# Patient Record
Sex: Female | Born: 1955 | Race: Black or African American | Hispanic: No | Marital: Married | Smoking: Never smoker
Health system: Southern US, Community
[De-identification: ages and names within clinical notes are randomized; demographics above are authoritative.]

## PROBLEM LIST (undated history)

## (undated) HISTORY — PX: UTERINE FIBROID EMBOLIZATION: SHX825

## (undated) HISTORY — PX: TUBAL LIGATION: SHX77

---

## 1998-09-20 ENCOUNTER — Other Ambulatory Visit: Admission: RE | Admit: 1998-09-20 | Discharge: 1998-09-20 | Payer: Self-pay | Admitting: Gynecology

## 1998-10-22 ENCOUNTER — Other Ambulatory Visit: Admission: RE | Admit: 1998-10-22 | Discharge: 1998-10-22 | Payer: Self-pay | Admitting: Gynecology

## 1998-10-22 ENCOUNTER — Encounter (INDEPENDENT_AMBULATORY_CARE_PROVIDER_SITE_OTHER): Payer: Self-pay | Admitting: Specialist

## 1998-11-21 ENCOUNTER — Encounter (INDEPENDENT_AMBULATORY_CARE_PROVIDER_SITE_OTHER): Payer: Self-pay

## 1998-11-21 ENCOUNTER — Other Ambulatory Visit: Admission: RE | Admit: 1998-11-21 | Discharge: 1998-11-21 | Payer: Self-pay | Admitting: Obstetrics

## 1998-11-23 ENCOUNTER — Encounter: Payer: Self-pay | Admitting: Obstetrics

## 1998-11-23 ENCOUNTER — Ambulatory Visit (HOSPITAL_COMMUNITY): Admission: RE | Admit: 1998-11-23 | Discharge: 1998-11-23 | Payer: Self-pay | Admitting: Obstetrics

## 1998-11-28 ENCOUNTER — Encounter (HOSPITAL_COMMUNITY): Admission: RE | Admit: 1998-11-28 | Discharge: 1999-02-26 | Payer: Self-pay | Admitting: Obstetrics

## 1998-12-21 ENCOUNTER — Encounter: Payer: Self-pay | Admitting: Diagnostic Radiology

## 1998-12-21 ENCOUNTER — Ambulatory Visit (HOSPITAL_COMMUNITY): Admission: RE | Admit: 1998-12-21 | Discharge: 1998-12-21 | Payer: Self-pay | Admitting: Diagnostic Radiology

## 1999-01-02 ENCOUNTER — Encounter: Payer: Self-pay | Admitting: Obstetrics

## 1999-01-02 ENCOUNTER — Ambulatory Visit (HOSPITAL_COMMUNITY): Admission: RE | Admit: 1999-01-02 | Discharge: 1999-01-02 | Payer: Self-pay | Admitting: Obstetrics

## 1999-02-01 ENCOUNTER — Observation Stay (HOSPITAL_COMMUNITY): Admission: RE | Admit: 1999-02-01 | Discharge: 1999-02-02 | Payer: Self-pay | Admitting: Obstetrics

## 1999-02-01 ENCOUNTER — Encounter: Payer: Self-pay | Admitting: Obstetrics

## 1999-08-05 ENCOUNTER — Ambulatory Visit (HOSPITAL_COMMUNITY): Admission: RE | Admit: 1999-08-05 | Discharge: 1999-08-05 | Payer: Self-pay | Admitting: Diagnostic Radiology

## 1999-08-05 ENCOUNTER — Encounter: Payer: Self-pay | Admitting: Diagnostic Radiology

## 2000-01-07 ENCOUNTER — Ambulatory Visit (HOSPITAL_COMMUNITY): Admission: RE | Admit: 2000-01-07 | Discharge: 2000-01-07 | Payer: Self-pay | Admitting: Obstetrics

## 2000-01-07 ENCOUNTER — Encounter: Payer: Self-pay | Admitting: Obstetrics

## 2000-04-15 ENCOUNTER — Other Ambulatory Visit: Admission: RE | Admit: 2000-04-15 | Discharge: 2000-04-15 | Payer: Self-pay | Admitting: Obstetrics

## 2001-04-30 ENCOUNTER — Encounter: Payer: Self-pay | Admitting: Obstetrics

## 2001-04-30 ENCOUNTER — Ambulatory Visit (HOSPITAL_COMMUNITY): Admission: RE | Admit: 2001-04-30 | Discharge: 2001-04-30 | Payer: Self-pay | Admitting: Obstetrics

## 2002-07-11 ENCOUNTER — Encounter: Payer: Self-pay | Admitting: Obstetrics

## 2002-07-11 ENCOUNTER — Ambulatory Visit (HOSPITAL_COMMUNITY): Admission: RE | Admit: 2002-07-11 | Discharge: 2002-07-11 | Payer: Self-pay | Admitting: Obstetrics

## 2003-10-09 ENCOUNTER — Ambulatory Visit (HOSPITAL_COMMUNITY): Admission: RE | Admit: 2003-10-09 | Discharge: 2003-10-09 | Payer: Self-pay | Admitting: Obstetrics

## 2004-12-17 ENCOUNTER — Ambulatory Visit (HOSPITAL_COMMUNITY): Admission: RE | Admit: 2004-12-17 | Discharge: 2004-12-17 | Payer: Self-pay

## 2005-01-16 ENCOUNTER — Ambulatory Visit (HOSPITAL_COMMUNITY): Admission: RE | Admit: 2005-01-16 | Discharge: 2005-01-16 | Payer: Self-pay | Admitting: Obstetrics

## 2008-05-02 ENCOUNTER — Ambulatory Visit (HOSPITAL_COMMUNITY): Admission: RE | Admit: 2008-05-02 | Discharge: 2008-05-02 | Payer: Self-pay | Admitting: Obstetrics

## 2010-03-01 ENCOUNTER — Ambulatory Visit (HOSPITAL_COMMUNITY)
Admission: RE | Admit: 2010-03-01 | Discharge: 2010-03-01 | Payer: Self-pay | Source: Home / Self Care | Admitting: Obstetrics

## 2010-03-11 ENCOUNTER — Encounter
Admission: RE | Admit: 2010-03-11 | Discharge: 2010-03-11 | Payer: Self-pay | Source: Home / Self Care | Attending: Obstetrics | Admitting: Obstetrics

## 2010-06-29 ENCOUNTER — Inpatient Hospital Stay (INDEPENDENT_AMBULATORY_CARE_PROVIDER_SITE_OTHER)
Admission: RE | Admit: 2010-06-29 | Discharge: 2010-06-29 | Disposition: A | Discharge status: Home / Self Care | Payer: 59 | Source: Ambulatory Visit | Attending: Family Medicine | Admitting: Family Medicine

## 2010-06-29 ENCOUNTER — Ambulatory Visit (INDEPENDENT_AMBULATORY_CARE_PROVIDER_SITE_OTHER): Payer: 59

## 2010-06-29 DIAGNOSIS — K5900 Constipation, unspecified: Secondary | ICD-10-CM

## 2010-06-29 DIAGNOSIS — R071 Chest pain on breathing: Secondary | ICD-10-CM

## 2010-06-29 LAB — POCT URINALYSIS DIP (DEVICE)
Bilirubin Urine: NEGATIVE
Glucose, UA: NEGATIVE mg/dL
Ketones, ur: NEGATIVE mg/dL
Protein, ur: NEGATIVE mg/dL
Specific Gravity, Urine: 1.015 (ref 1.005–1.030)
Urobilinogen, UA: 0.2 mg/dL (ref 0.0–1.0)

## 2010-07-01 LAB — URINE CULTURE

## 2011-02-26 ENCOUNTER — Other Ambulatory Visit (HOSPITAL_COMMUNITY): Payer: Self-pay | Admitting: Obstetrics

## 2011-02-26 DIAGNOSIS — Z1231 Encounter for screening mammogram for malignant neoplasm of breast: Secondary | ICD-10-CM

## 2011-04-03 ENCOUNTER — Ambulatory Visit (HOSPITAL_COMMUNITY)
Admission: RE | Admit: 2011-04-03 | Discharge: 2011-04-03 | Disposition: A | Discharge status: Home / Self Care | Payer: 59 | Source: Ambulatory Visit | Attending: Obstetrics | Admitting: Obstetrics

## 2011-04-03 DIAGNOSIS — Z1231 Encounter for screening mammogram for malignant neoplasm of breast: Secondary | ICD-10-CM | POA: Insufficient documentation

## 2013-01-11 ENCOUNTER — Other Ambulatory Visit (HOSPITAL_COMMUNITY): Payer: Self-pay | Admitting: Obstetrics

## 2013-01-11 DIAGNOSIS — Z1231 Encounter for screening mammogram for malignant neoplasm of breast: Secondary | ICD-10-CM

## 2013-01-28 ENCOUNTER — Other Ambulatory Visit (HOSPITAL_COMMUNITY): Payer: Self-pay | Admitting: Obstetrics

## 2013-01-28 ENCOUNTER — Ambulatory Visit (HOSPITAL_COMMUNITY)
Admission: RE | Admit: 2013-01-28 | Discharge: 2013-01-28 | Disposition: A | Discharge status: Home / Self Care | Payer: 59 | Source: Ambulatory Visit | Attending: Obstetrics | Admitting: Obstetrics

## 2013-01-28 DIAGNOSIS — Z1231 Encounter for screening mammogram for malignant neoplasm of breast: Secondary | ICD-10-CM | POA: Insufficient documentation

## 2013-01-31 ENCOUNTER — Ambulatory Visit (HOSPITAL_COMMUNITY): Admission: RE | Admit: 2013-01-31 | Payer: 59 | Source: Ambulatory Visit

## 2014-02-20 ENCOUNTER — Ambulatory Visit
Admission: RE | Admit: 2014-02-20 | Discharge: 2014-02-20 | Disposition: A | Discharge status: Home / Self Care | Payer: 59 | Source: Ambulatory Visit | Attending: Family Medicine | Admitting: Family Medicine

## 2014-02-20 ENCOUNTER — Other Ambulatory Visit: Payer: Self-pay | Admitting: Family Medicine

## 2014-02-20 DIAGNOSIS — M542 Cervicalgia: Secondary | ICD-10-CM

## 2014-02-20 DIAGNOSIS — M545 Low back pain: Secondary | ICD-10-CM

## 2014-03-03 ENCOUNTER — Ambulatory Visit: Payer: 59 | Attending: Family Medicine | Admitting: Physical Therapy

## 2014-03-03 DIAGNOSIS — M545 Low back pain: Secondary | ICD-10-CM | POA: Insufficient documentation

## 2014-03-08 ENCOUNTER — Ambulatory Visit: Payer: 59 | Admitting: Physical Therapy

## 2014-03-08 DIAGNOSIS — M545 Low back pain: Secondary | ICD-10-CM | POA: Diagnosis not present

## 2014-03-10 ENCOUNTER — Ambulatory Visit: Payer: 59 | Admitting: Physical Therapy

## 2014-03-10 DIAGNOSIS — M545 Low back pain: Secondary | ICD-10-CM | POA: Diagnosis not present

## 2014-03-13 ENCOUNTER — Ambulatory Visit: Payer: 59 | Admitting: Physical Therapy

## 2014-03-13 DIAGNOSIS — M545 Low back pain: Secondary | ICD-10-CM | POA: Diagnosis not present

## 2014-03-14 ENCOUNTER — Encounter: Payer: 59 | Admitting: Physical Therapy

## 2014-03-17 ENCOUNTER — Encounter: Payer: 59 | Admitting: Physical Therapy

## 2014-03-17 ENCOUNTER — Ambulatory Visit: Payer: 59 | Admitting: Physical Therapy

## 2014-03-17 DIAGNOSIS — M545 Low back pain: Secondary | ICD-10-CM | POA: Diagnosis not present

## 2014-03-20 ENCOUNTER — Ambulatory Visit: Payer: 59 | Admitting: Physical Therapy

## 2014-03-20 DIAGNOSIS — M545 Low back pain: Secondary | ICD-10-CM | POA: Diagnosis not present

## 2014-03-28 ENCOUNTER — Ambulatory Visit: Payer: 59 | Admitting: Physical Therapy

## 2014-03-28 DIAGNOSIS — M545 Low back pain: Secondary | ICD-10-CM | POA: Diagnosis not present

## 2014-04-03 ENCOUNTER — Ambulatory Visit: Payer: 59 | Attending: Family Medicine | Admitting: Physical Therapy

## 2014-04-03 DIAGNOSIS — M545 Low back pain: Secondary | ICD-10-CM | POA: Diagnosis not present

## 2014-04-07 ENCOUNTER — Ambulatory Visit: Payer: 59 | Admitting: Physical Therapy

## 2014-04-07 DIAGNOSIS — M545 Low back pain: Secondary | ICD-10-CM | POA: Diagnosis not present

## 2014-04-11 ENCOUNTER — Ambulatory Visit: Payer: 59 | Admitting: Physical Therapy

## 2014-04-11 DIAGNOSIS — M545 Low back pain: Secondary | ICD-10-CM | POA: Diagnosis not present

## 2014-04-15 DIAGNOSIS — M545 Low back pain: Secondary | ICD-10-CM | POA: Diagnosis present

## 2014-04-17 ENCOUNTER — Ambulatory Visit: Payer: 59 | Admitting: Physical Therapy

## 2014-04-17 DIAGNOSIS — M545 Low back pain: Secondary | ICD-10-CM | POA: Diagnosis not present

## 2014-04-21 ENCOUNTER — Ambulatory Visit: Payer: 59 | Admitting: Physical Therapy

## 2014-04-24 ENCOUNTER — Ambulatory Visit: Payer: 59 | Admitting: Physical Therapy

## 2018-11-15 ENCOUNTER — Ambulatory Visit (HOSPITAL_COMMUNITY)
Admission: EM | Admit: 2018-11-15 | Discharge: 2018-11-15 | Disposition: A | Discharge status: Home / Self Care | Payer: BC Managed Care – PPO | Attending: Family Medicine | Admitting: Family Medicine

## 2018-11-15 ENCOUNTER — Encounter (HOSPITAL_COMMUNITY): Payer: Self-pay

## 2018-11-15 ENCOUNTER — Other Ambulatory Visit: Payer: Self-pay

## 2018-11-15 DIAGNOSIS — R002 Palpitations: Secondary | ICD-10-CM

## 2018-11-15 DIAGNOSIS — R42 Dizziness and giddiness: Secondary | ICD-10-CM | POA: Insufficient documentation

## 2018-11-15 LAB — CBC WITH DIFFERENTIAL/PLATELET
Abs Immature Granulocytes: 0.03 10*3/uL (ref 0.00–0.07)
Basophils Absolute: 0 10*3/uL (ref 0.0–0.1)
Basophils Relative: 1 %
Eosinophils Absolute: 0 10*3/uL (ref 0.0–0.5)
Eosinophils Relative: 0 %
HCT: 39.2 % (ref 36.0–46.0)
Hemoglobin: 13 g/dL (ref 12.0–15.0)
Immature Granulocytes: 1 %
Lymphocytes Relative: 42 %
Lymphs Abs: 2.5 10*3/uL (ref 0.7–4.0)
MCH: 32 pg (ref 26.0–34.0)
MCHC: 33.2 g/dL (ref 30.0–36.0)
MCV: 96.6 fL (ref 80.0–100.0)
Monocytes Absolute: 0.4 10*3/uL (ref 0.1–1.0)
Monocytes Relative: 8 %
Neutro Abs: 2.8 10*3/uL (ref 1.7–7.7)
Neutrophils Relative %: 48 %
Platelets: 227 10*3/uL (ref 150–400)
RBC: 4.06 MIL/uL (ref 3.87–5.11)
RDW: 12.3 % (ref 11.5–15.5)
WBC: 5.8 10*3/uL (ref 4.0–10.5)
nRBC: 0 % (ref 0.0–0.2)

## 2018-11-15 LAB — COMPREHENSIVE METABOLIC PANEL
ALT: 20 U/L (ref 0–44)
AST: 27 U/L (ref 15–41)
Albumin: 4.1 g/dL (ref 3.5–5.0)
Alkaline Phosphatase: 88 U/L (ref 38–126)
Anion gap: 11 (ref 5–15)
BUN: 11 mg/dL (ref 8–23)
CO2: 26 mmol/L (ref 22–32)
Calcium: 9.7 mg/dL (ref 8.9–10.3)
Chloride: 103 mmol/L (ref 98–111)
Creatinine, Ser: 0.81 mg/dL (ref 0.44–1.00)
GFR calc Af Amer: >60 mL/min (ref >60)
GFR calc non Af Amer: >60 mL/min (ref >60)
Glucose, Bld: 95 mg/dL (ref 70–99)
Potassium: 3.9 mmol/L (ref 3.5–5.1)
Sodium: 140 mmol/L (ref 135–145)
Total Bilirubin: 0.6 mg/dL (ref 0.3–1.2)
Total Protein: 7.5 g/dL (ref 6.5–8.1)

## 2018-11-15 LAB — POCT URINALYSIS DIP (DEVICE)
Bilirubin Urine: NEGATIVE
Glucose, UA: NEGATIVE mg/dL
Ketones, ur: NEGATIVE mg/dL
Leukocytes,Ua: NEGATIVE
Nitrite: NEGATIVE
Protein, ur: NEGATIVE mg/dL
Specific Gravity, Urine: 1.025 (ref 1.005–1.030)
Urobilinogen, UA: 0.2 mg/dL (ref 0.0–1.0)
pH: 7 (ref 5.0–8.0)

## 2018-11-15 LAB — TSH: TSH: 1.34 u[IU]/mL (ref 0.350–4.500)

## 2018-11-15 NOTE — ED Provider Notes (Signed)
[facility]    CSN: [id number] Arrival date & time: 11/15/18  1305     History   Chief Complaint Chief Complaint  Patient presents with  . Appointment    110  . Diarrhea    HPI Heather Stark is a 63 y.o. female.   Heather Stark presents with complaints of episodes of palpitations with nausea and dizziness. This started two weeks ago. She had two days of diarrhea. Once this had resolved, she was working with her husband, and was up in a hot attic when her symptoms started. Her husband felt the same way. Once they came down from the attic into the cool he improved. She feels like the episodes have persisted. She has since had normal bowel movements. She currently feels like her heart is strongly beating and has some dizziness. No chest pain . No cough, no sore throat, no nausea or vomiting. Denies any previous similar episodes. She has been eating and drinking. She feels like she has been urinating more frequently, and when she urinates this causes her symptoms of dizziness and palpitations to increase. She had two episodes yesterday. States that last night she noted that her heart was beating quickly and then she felt discomfort to her left neck as well as to her left arm which also felt tingly. She denies any of these symptoms currently. She has no medical history, she takes supplements only, no prescribed medications. She has been taking electrolyte drinks which have helped just briefly with her symptoms before they return.     ROS per HPI, negative if not otherwise mentioned.      History reviewed. No pertinent past medical history.  There are no active problems to display for this patient.   Past Surgical History:  Procedure Laterality Date  . TUBAL LIGATION    . UTERINE FIBROID EMBOLIZATION      OB History   No obstetric history on file.      Home Medications    Prior to Admission medications   Medication Sig Start Date End Date Taking?  Authorizing Provider  Magnesium 400 MG CAPS Take by mouth.   Yes [provider]  Multiple Vitamins-Minerals (MULTIVITAMIN WITH MINERALS) tablet Take 1 tablet by mouth daily.   Yes [provider]  potassium chloride (MICRO-K) 10 MEQ CR capsule Take 10 mEq by mouth 2 (two) times daily.   Yes [provider]  Probiotic Product (UP4 PROBIOTICS ADULT PO) Take by mouth.   Yes [provider]  Turmeric (QC TUMERIC COMPLEX) 500 MG CAPS Take by mouth.   Yes [provider]    Family History Family History  Problem Relation Age of Onset  . Healthy Mother   . Healthy Father     Social History Social History   Tobacco Use  . Smoking status: Never Smoker  . Smokeless tobacco: Never Used  Substance Use Topics  . Alcohol use: Never    Frequency: Never  . Drug use: Never     Allergies   Tetracyclines & related   Review of Systems Review of Systems   Physical Exam Triage Vital Signs ED Triage Vitals  Enc Vitals Group     BP 11/15/18 1338 (!) 150/87     Pulse Rate 11/15/18 1338 98     Resp 11/15/18 1338 18     Temp 11/15/18 1338 98.2 F (36.8 C)     Temp src --      SpO2 11/15/18 1338 99 %  Weight --      Height --      Head Circumference --      Peak Flow --      Pain Score 11/15/18 1339 0     Pain Loc --      Pain Edu? --      Excl. in GC? --    Orthostatic VS for the past 24 hrs:  BP- Lying Pulse- Lying BP- Sitting Pulse- Sitting BP- Standing at 0 minutes Pulse- Standing at 0 minutes  11/15/18 1443 140/85 87 134/84 91 (!) 145/98 93    Updated Vital Signs BP (!) 150/87   Pulse 98   Temp 98.2 F (36.8 C)   Resp 18   SpO2 99%    Physical Exam Constitutional:      General: She is not in acute distress.    Appearance: She is well-developed.  HENT:     Head: Normocephalic and atraumatic.  Eyes:     Extraocular Movements: Extraocular movements intact.     Pupils: Pupils are equal, round, and reactive to light.   Neck:     Musculoskeletal: Full passive range of motion without pain and normal range of motion. No pain with movement.  Cardiovascular:     Rate and Rhythm: Normal rate and regular rhythm.     Heart sounds: Normal heart sounds.  Pulmonary:     Effort: Pulmonary effort is normal.     Breath sounds: Normal breath sounds.  Skin:    General: Skin is warm and dry.  Neurological:     General: No focal deficit present.     Mental Status: She is alert and oriented to person, place, and time.     Cranial Nerves: No cranial nerve deficit.     Sensory: No sensory deficit.     Motor: No weakness.     Gait: Gait normal.  Psychiatric:        Mood and Affect: Mood normal.     EKG:  NSR rate of 92   . Previous EKG was not available for review. t wave flattening noted to v2. No other acute changes noted.  Reviewed with supervising physician dr. Tracie Harrierhagler    UC Treatments / Results  Labs (all labs ordered are listed, but only abnormal results are displayed) Labs Reviewed  POCT URINALYSIS DIP (DEVICE) - Abnormal; Notable for the following components:      Result Value   Hgb urine dipstick TRACE (*)    All other components within normal limits  COMPREHENSIVE METABOLIC PANEL  CBC WITH DIFFERENTIAL/PLATELET  TSH    EKG   Radiology No results found.  Procedures Procedures (including critical care time)  Medications Ordered in UC Medications - No data to display  Initial Impression / Assessment and Plan / UC Course  I have reviewed the triage vital signs and the nursing notes.  Pertinent labs & imaging results that were available during my care of the patient were reviewed by me and considered in my medical decision making (see chart for details).     Exam is overall reassuring. ekg reassuring. Urine normal. Labs pending. Question sensation of palpitations with neck and arm pain- encouraged to go to ER if this recurs as may need acs work up. No current symptoms like this. No red flag  findings on physical exam. Encouraged to establish care with PCP for recheck and management. Patient has been taking OTC supplementations including potassium as well, will see what CMP looks like. Return precautions provided.  Ambulatory out of clinic without difficulty.   Final Clinical Impressions(s) / UC Diagnoses   Final diagnoses:  Palpitations  Dizziness     Discharge Instructions     Your ekg and urine look good today.  Your other labs are still in process.  Will notify of any positive findings and if any changes to treatment are needed.   Continue to drink plenty of fluids to maintain hydration.  If any worsening of pain with jaw/arm pain or any associated nausea or sweating please go to the ER as may need further work-up. I do think its appropriate for you to follow up with cardiology and with primary care, especially if this persists.     ED Prescriptions    None     Controlled Substance Prescriptions [place] Controlled Substance Registry consulted? Not Applicable   Zigmund Gottron, NP 11/15/18 1557

## 2018-11-15 NOTE — ED Triage Notes (Signed)
Pt presents with complaints of diarrhea that lasted 2 days and started 2 weeks ago. States that she then went and worked in the heat about a week and a half ago. When she left the job that day she felt very dizzy and like her heart rate was fast. She reports that she is still feeling dehydrated and has been having aching in her neck.

## 2018-11-15 NOTE — Discharge Instructions (Signed)
Your ekg and urine look good today.  Your other labs are still in process.  Will notify of any positive findings and if any changes to treatment are needed.   Continue to drink plenty of fluids to maintain hydration.  If any worsening of pain with jaw/arm pain or any associated nausea or sweating please go to the ER as may need further work-up. I do think its appropriate for you to follow up with cardiology and with primary care, especially if this persists.

## 2019-02-07 DIAGNOSIS — E785 Hyperlipidemia, unspecified: Secondary | ICD-10-CM | POA: Diagnosis not present

## 2019-02-07 DIAGNOSIS — Z1211 Encounter for screening for malignant neoplasm of colon: Secondary | ICD-10-CM | POA: Diagnosis not present

## 2019-02-07 DIAGNOSIS — Z1231 Encounter for screening mammogram for malignant neoplasm of breast: Secondary | ICD-10-CM | POA: Diagnosis not present

## 2019-02-07 DIAGNOSIS — K5901 Slow transit constipation: Secondary | ICD-10-CM | POA: Diagnosis not present

## 2019-02-08 ENCOUNTER — Other Ambulatory Visit: Payer: Self-pay | Admitting: Internal Medicine

## 2019-02-08 DIAGNOSIS — Z1231 Encounter for screening mammogram for malignant neoplasm of breast: Secondary | ICD-10-CM

## 2019-03-31 ENCOUNTER — Ambulatory Visit
Admission: RE | Admit: 2019-03-31 | Discharge: 2019-03-31 | Disposition: A | Discharge status: Home / Self Care | Payer: BC Managed Care – PPO | Source: Ambulatory Visit | Attending: Internal Medicine | Admitting: Internal Medicine

## 2019-03-31 ENCOUNTER — Other Ambulatory Visit: Payer: Self-pay

## 2019-03-31 DIAGNOSIS — Z1231 Encounter for screening mammogram for malignant neoplasm of breast: Secondary | ICD-10-CM

## 2019-04-04 ENCOUNTER — Other Ambulatory Visit: Payer: Self-pay | Admitting: Internal Medicine

## 2019-04-04 DIAGNOSIS — R928 Other abnormal and inconclusive findings on diagnostic imaging of breast: Secondary | ICD-10-CM

## 2019-04-06 ENCOUNTER — Ambulatory Visit
Admission: RE | Admit: 2019-04-06 | Discharge: 2019-04-06 | Disposition: A | Discharge status: Home / Self Care | Payer: BC Managed Care – PPO | Source: Ambulatory Visit | Attending: Internal Medicine | Admitting: Internal Medicine

## 2019-04-06 DIAGNOSIS — R928 Other abnormal and inconclusive findings on diagnostic imaging of breast: Secondary | ICD-10-CM

## 2019-04-06 DIAGNOSIS — R922 Inconclusive mammogram: Secondary | ICD-10-CM | POA: Diagnosis not present

## 2019-04-06 DIAGNOSIS — N6002 Solitary cyst of left breast: Secondary | ICD-10-CM | POA: Diagnosis not present

## 2019-05-11 DIAGNOSIS — Z1159 Encounter for screening for other viral diseases: Secondary | ICD-10-CM | POA: Diagnosis not present

## 2019-05-16 DIAGNOSIS — K635 Polyp of colon: Secondary | ICD-10-CM | POA: Diagnosis not present

## 2019-05-16 DIAGNOSIS — Z1211 Encounter for screening for malignant neoplasm of colon: Secondary | ICD-10-CM | POA: Diagnosis not present

## 2019-06-04 ENCOUNTER — Ambulatory Visit: Payer: BC Managed Care – PPO | Attending: Internal Medicine

## 2019-06-04 DIAGNOSIS — Z23 Encounter for immunization: Secondary | ICD-10-CM | POA: Insufficient documentation

## 2019-06-04 NOTE — Progress Notes (Signed)
   Covid-19 Vaccination Clinic  Name:  Heather Stark    MRN: [id number] DOB: 07-Jun-1955  06/04/2019  [name] was observed post Covid-19 immunization for 15 minutes without incident. She was provided with Vaccine Information Sheet and instruction to access the V-Safe system.   [name] was instructed to call 911 with any severe reactions post vaccine: Marland Kitchen Difficulty breathing  . Swelling of face and throat  . A fast heartbeat  . A bad rash all over body  . Dizziness and weakness   Immunizations Administered    Name Date Dose VIS Date Route   Pfizer COVID-19 Vaccine 06/04/2019  1:12 PM 0.3 mL 03/11/2019 Intramuscular   Manufacturer: [employer], [employer]   Lot: [id number]   NDC: [address]-1

## 2019-06-25 ENCOUNTER — Ambulatory Visit: Payer: BC Managed Care – PPO

## 2019-06-27 ENCOUNTER — Ambulatory Visit: Payer: BC Managed Care – PPO | Attending: Internal Medicine

## 2019-06-27 DIAGNOSIS — Z23 Encounter for immunization: Secondary | ICD-10-CM

## 2019-06-27 NOTE — Progress Notes (Signed)
   Covid-19 Vaccination Clinic  Name:  SHRISTI SCHEIB    MRN: [id number] DOB: 14-Mar-1956  06/27/2019  Ms. Basden was observed post Covid-19 immunization for 15 minutes without incident. She was provided with Vaccine Information Sheet and instruction to access the V-Safe system.   Ms. Dade was instructed to call 911 with any severe reactions post vaccine: Marland Kitchen Difficulty breathing  . Swelling of face and throat  . A fast heartbeat  . A bad rash all over body  . Dizziness and weakness   Immunizations Administered    Name Date Dose VIS Date Route   Pfizer COVID-19 Vaccine 06/27/2019 11:22 AM 0.3 mL 03/11/2019 Intramuscular   Manufacturer: [employer], [employer]   Lot: [id number]   NDC: [address]-0

## 2019-07-05 ENCOUNTER — Ambulatory Visit: Payer: BC Managed Care – PPO

## 2019-07-06 ENCOUNTER — Ambulatory Visit: Payer: BC Managed Care – PPO

## 2019-07-11 DIAGNOSIS — L658 Other specified nonscarring hair loss: Secondary | ICD-10-CM | POA: Diagnosis not present

## 2019-07-22 ENCOUNTER — Other Ambulatory Visit: Payer: Self-pay | Admitting: Internal Medicine

## 2019-07-22 ENCOUNTER — Other Ambulatory Visit (HOSPITAL_COMMUNITY)
Admission: RE | Admit: 2019-07-22 | Discharge: 2019-07-22 | Disposition: A | Discharge status: Home / Self Care | Payer: BC Managed Care – PPO | Source: Ambulatory Visit | Attending: Internal Medicine | Admitting: Internal Medicine

## 2019-07-22 DIAGNOSIS — Z124 Encounter for screening for malignant neoplasm of cervix: Secondary | ICD-10-CM | POA: Diagnosis not present

## 2019-07-22 DIAGNOSIS — Z0000 Encounter for general adult medical examination without abnormal findings: Secondary | ICD-10-CM | POA: Diagnosis not present

## 2019-07-27 LAB — CYTOLOGY - PAP
Adequacy: ABSENT
Comment: NEGATIVE
Diagnosis: NEGATIVE
High risk HPV: NEGATIVE

## 2020-07-27 ENCOUNTER — Other Ambulatory Visit: Payer: Self-pay | Admitting: Internal Medicine

## 2020-07-27 DIAGNOSIS — E2839 Other primary ovarian failure: Secondary | ICD-10-CM

## 2020-07-27 DIAGNOSIS — Z1231 Encounter for screening mammogram for malignant neoplasm of breast: Secondary | ICD-10-CM

## 2020-08-30 ENCOUNTER — Ambulatory Visit
Admission: RE | Admit: 2020-08-30 | Discharge: 2020-08-30 | Disposition: A | Discharge status: Home / Self Care | Payer: 59 | Source: Ambulatory Visit | Attending: Internal Medicine | Admitting: Internal Medicine

## 2020-08-30 ENCOUNTER — Other Ambulatory Visit: Payer: Self-pay

## 2020-08-30 DIAGNOSIS — Z1231 Encounter for screening mammogram for malignant neoplasm of breast: Secondary | ICD-10-CM

## 2020-08-30 DIAGNOSIS — E2839 Other primary ovarian failure: Secondary | ICD-10-CM

## 2021-03-16 IMAGING — MG DIGITAL SCREENING BILAT W/ TOMO W/ CAD
8 series · 9 of 24 positions shown · non-contrast
Comparison: Previous exam(s).

CLINICAL DATA: Screening.

EXAM:
DIGITAL SCREENING BILATERAL MAMMOGRAM WITH TOMO AND CAD

[R CC synth-2D]
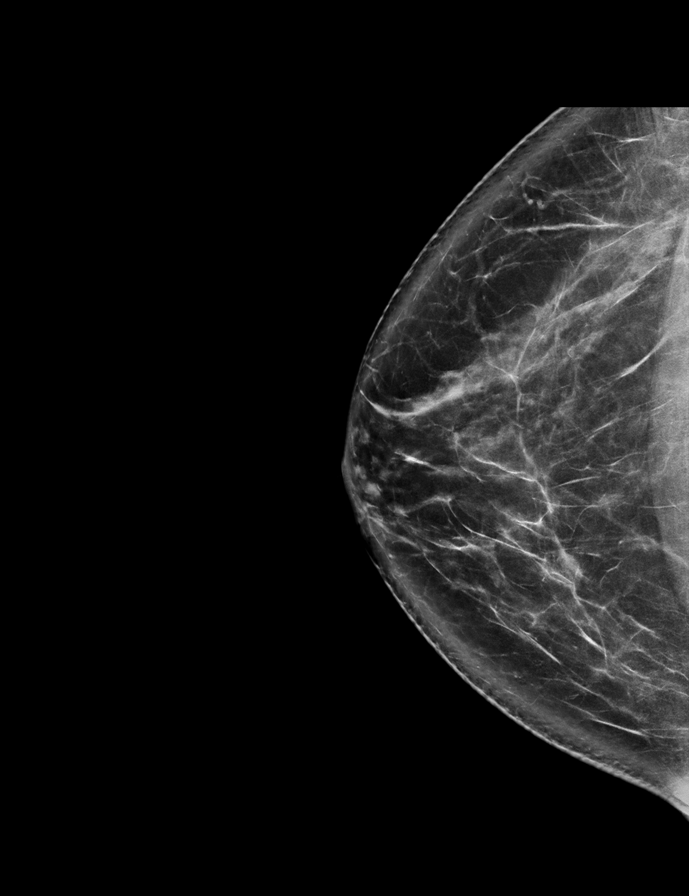

[R MLO synth-2D]
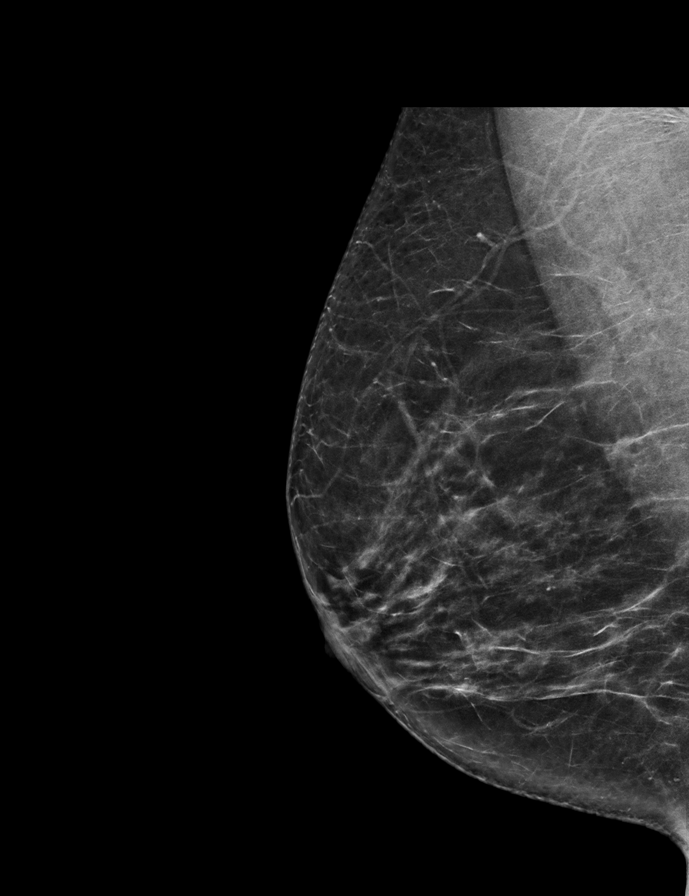

[L CC synth-2D]
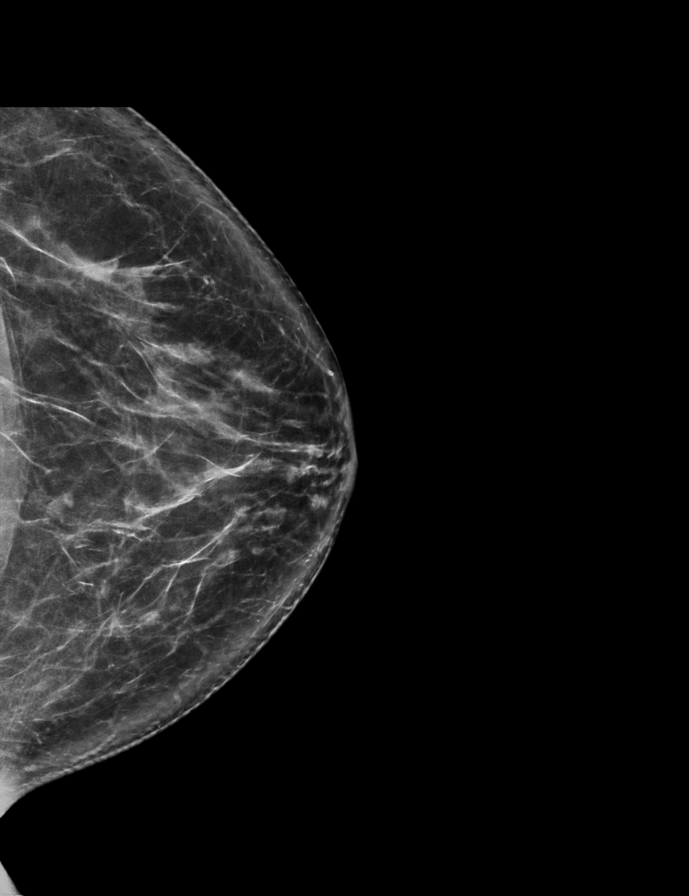

[L MLO synth-2D]
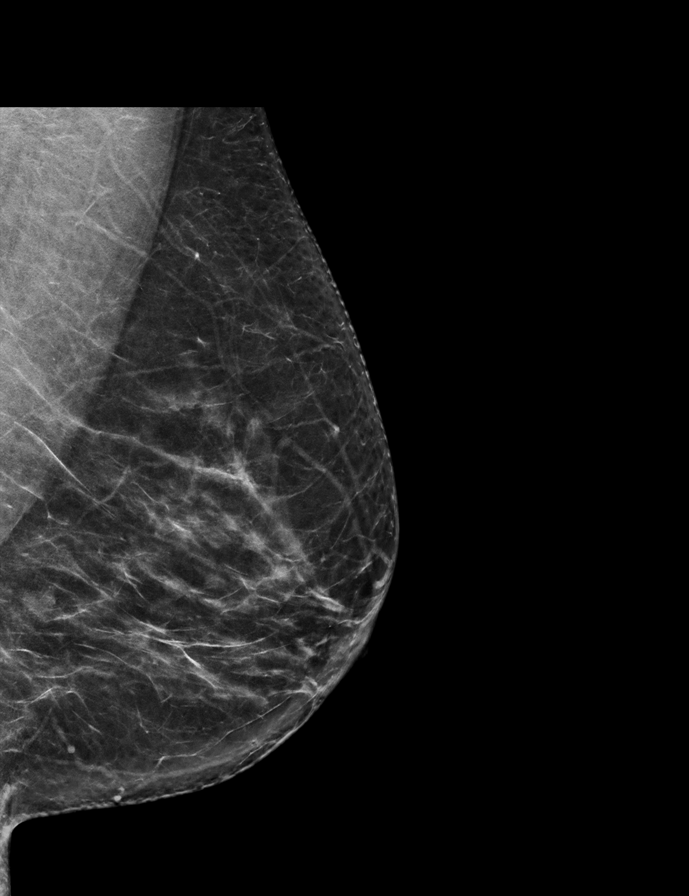

[L CC tomo · 2 of 75 frames shown]
[frame 25/75]
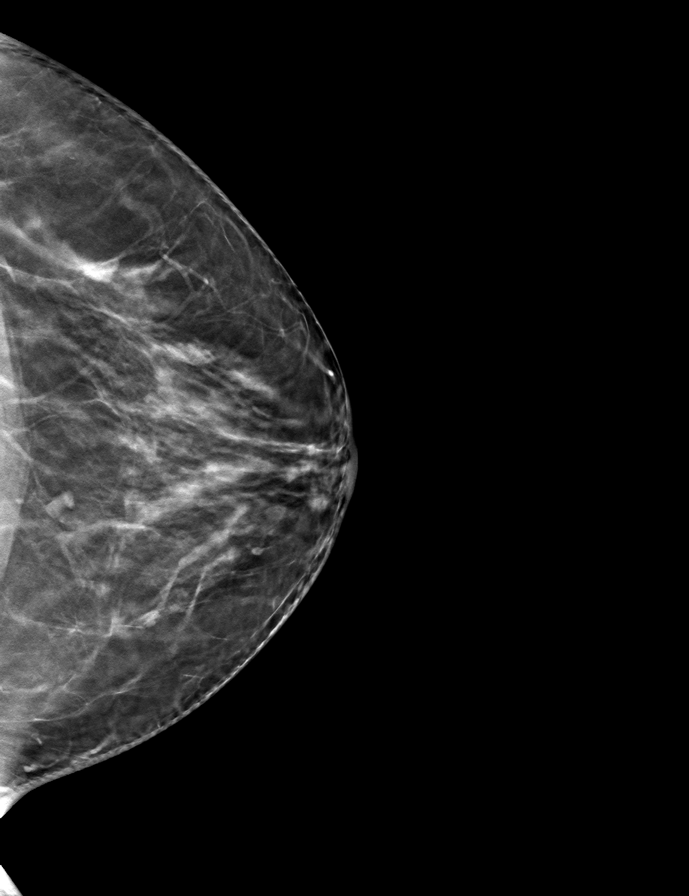
[frame 38/75]
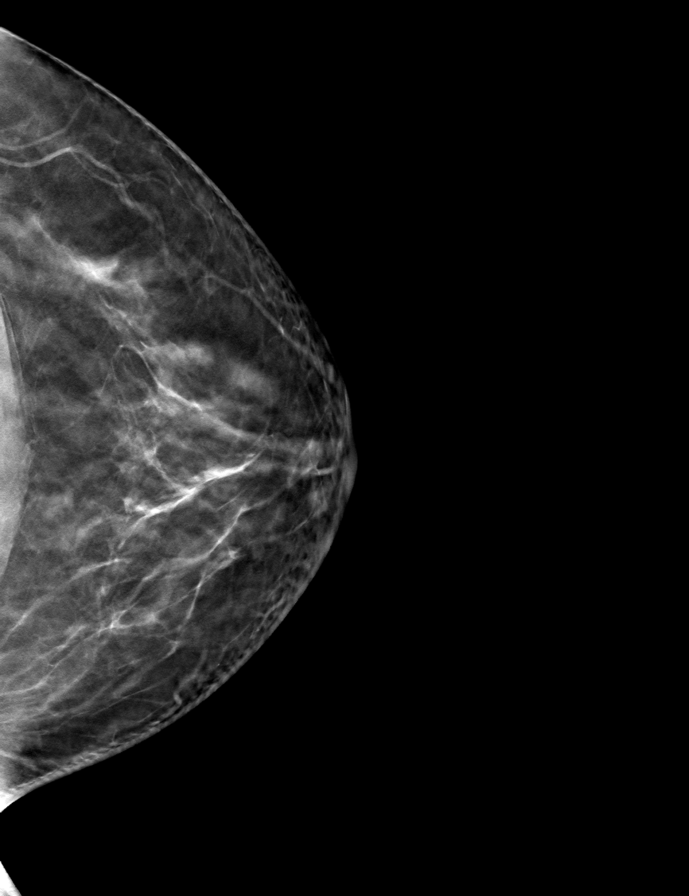

[L MLO tomo · tomo slice 38/75.0]
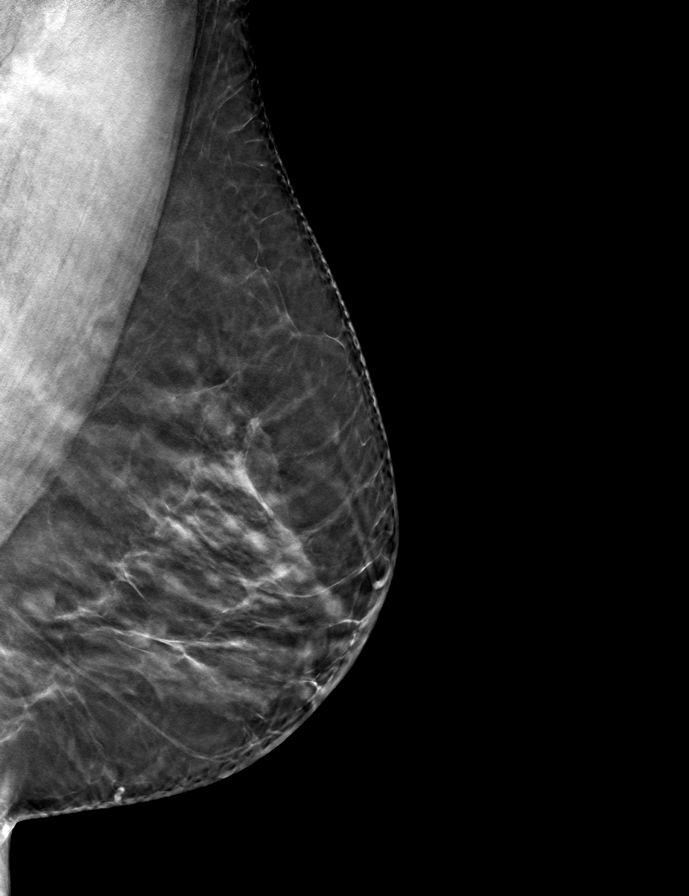

[R CC tomo · tomo slice 41/82.0]
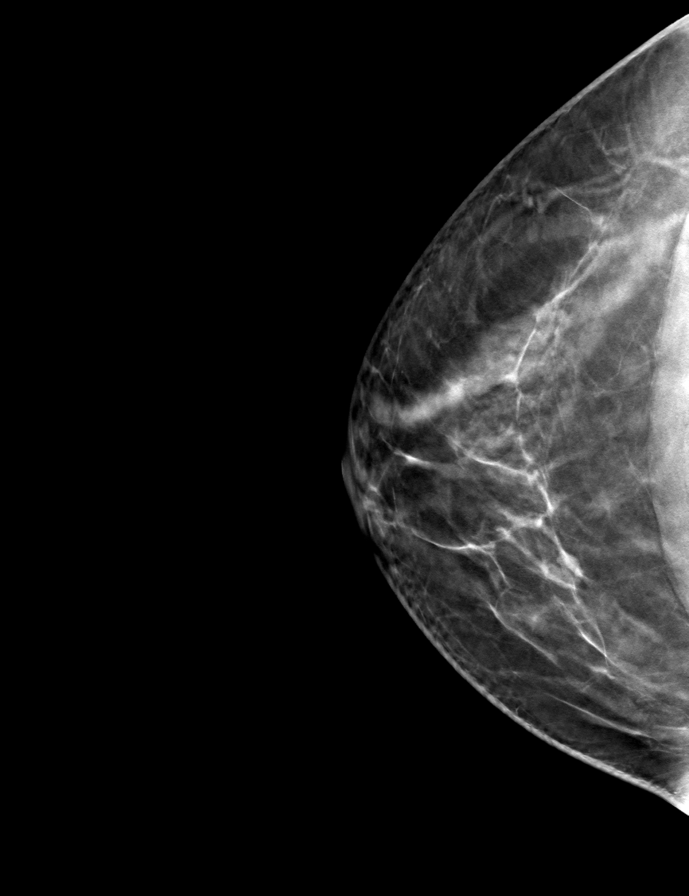

[R MLO tomo · tomo slice 37/72.0]
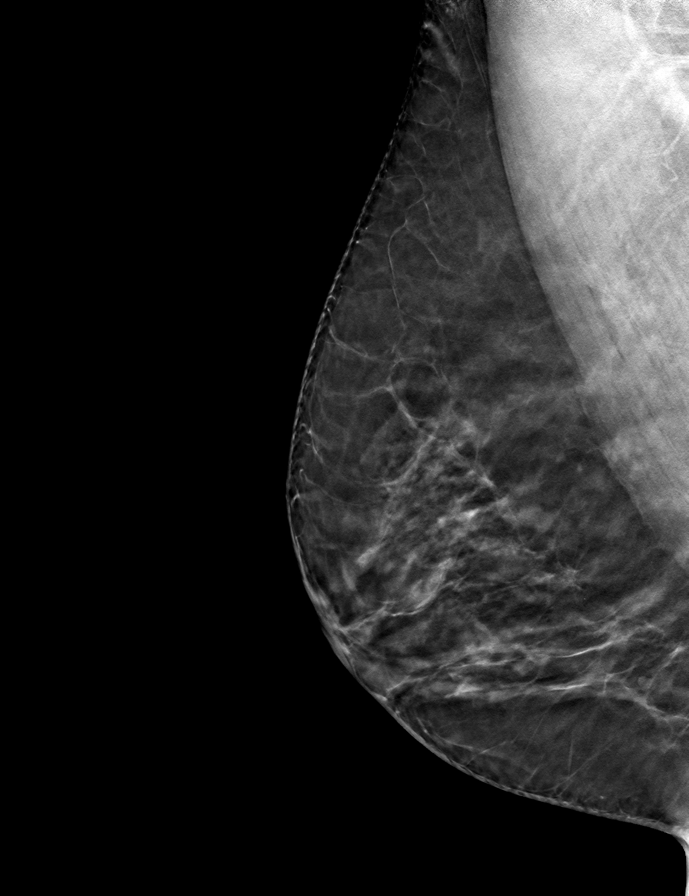

[9 of 24 positions shown; findings below may reference images not displayed]

ACR Breast Density Category c: The breast tissue is heterogeneously
dense, which may obscure small masses.
FINDINGS: In the left breast, a possible mass warrants further evaluation. In
the right breast, no findings suspicious for malignancy.

Images were processed with CAD.
IMPRESSION: Further evaluation is suggested for possible mass in the left
breast.

RECOMMENDATION:
Diagnostic mammogram and possibly ultrasound of the left breast.
(Code:[id number])

The patient will be contacted regarding the findings, and additional
imaging will be scheduled.

BI-RADS CATEGORY  0: Incomplete. Need additional imaging evaluation
and/or prior mammograms for comparison.

## 2022-09-10 ENCOUNTER — Other Ambulatory Visit: Payer: Self-pay | Admitting: Internal Medicine

## 2022-09-10 ENCOUNTER — Ambulatory Visit
Admission: RE | Admit: 2022-09-10 | Discharge: 2022-09-10 | Disposition: A | Discharge status: Home / Self Care | Payer: PPO | Source: Ambulatory Visit | Attending: Internal Medicine | Admitting: Internal Medicine

## 2022-09-10 DIAGNOSIS — D1721 Benign lipomatous neoplasm of skin and subcutaneous tissue of right arm: Secondary | ICD-10-CM

## 2022-09-10 DIAGNOSIS — I10 Essential (primary) hypertension: Secondary | ICD-10-CM | POA: Diagnosis not present

## 2022-09-10 DIAGNOSIS — Z0000 Encounter for general adult medical examination without abnormal findings: Secondary | ICD-10-CM | POA: Diagnosis not present

## 2022-09-10 DIAGNOSIS — E785 Hyperlipidemia, unspecified: Secondary | ICD-10-CM | POA: Diagnosis not present

## 2022-09-10 DIAGNOSIS — Z1231 Encounter for screening mammogram for malignant neoplasm of breast: Secondary | ICD-10-CM | POA: Diagnosis not present

## 2022-09-10 DIAGNOSIS — D1720 Benign lipomatous neoplasm of skin and subcutaneous tissue of unspecified limb: Secondary | ICD-10-CM | POA: Diagnosis not present

## 2022-09-10 DIAGNOSIS — E669 Obesity, unspecified: Secondary | ICD-10-CM | POA: Diagnosis not present

## 2022-09-10 DIAGNOSIS — Z1211 Encounter for screening for malignant neoplasm of colon: Secondary | ICD-10-CM | POA: Diagnosis not present

## 2022-09-23 DIAGNOSIS — D1721 Benign lipomatous neoplasm of skin and subcutaneous tissue of right arm: Secondary | ICD-10-CM | POA: Diagnosis not present

## 2022-09-24 ENCOUNTER — Other Ambulatory Visit (HOSPITAL_COMMUNITY): Payer: Self-pay | Admitting: Surgery

## 2022-09-24 DIAGNOSIS — D1721 Benign lipomatous neoplasm of skin and subcutaneous tissue of right arm: Secondary | ICD-10-CM

## 2022-10-01 ENCOUNTER — Ambulatory Visit (HOSPITAL_COMMUNITY)
Admission: RE | Admit: 2022-10-01 | Discharge: 2022-10-01 | Disposition: A | Discharge status: Home / Self Care | Payer: PPO | Source: Ambulatory Visit | Attending: Surgery | Admitting: Surgery

## 2022-10-01 DIAGNOSIS — D1721 Benign lipomatous neoplasm of skin and subcutaneous tissue of right arm: Secondary | ICD-10-CM | POA: Diagnosis not present

## 2022-10-01 DIAGNOSIS — R2231 Localized swelling, mass and lump, right upper limb: Secondary | ICD-10-CM | POA: Diagnosis not present

## 2022-11-18 DIAGNOSIS — D1721 Benign lipomatous neoplasm of skin and subcutaneous tissue of right arm: Secondary | ICD-10-CM | POA: Diagnosis not present

## 2022-11-25 DIAGNOSIS — R2231 Localized swelling, mass and lump, right upper limb: Secondary | ICD-10-CM | POA: Diagnosis not present

## 2022-11-25 DIAGNOSIS — D1721 Benign lipomatous neoplasm of skin and subcutaneous tissue of right arm: Secondary | ICD-10-CM | POA: Diagnosis not present

## 2022-12-16 DIAGNOSIS — D1721 Benign lipomatous neoplasm of skin and subcutaneous tissue of right arm: Secondary | ICD-10-CM | POA: Diagnosis not present

## 2022-12-24 DIAGNOSIS — I498 Other specified cardiac arrhythmias: Secondary | ICD-10-CM | POA: Diagnosis not present

## 2022-12-24 DIAGNOSIS — Z01810 Encounter for preprocedural cardiovascular examination: Secondary | ICD-10-CM | POA: Diagnosis not present

## 2022-12-24 DIAGNOSIS — R030 Elevated blood-pressure reading, without diagnosis of hypertension: Secondary | ICD-10-CM | POA: Diagnosis not present

## 2022-12-24 DIAGNOSIS — D1721 Benign lipomatous neoplasm of skin and subcutaneous tissue of right arm: Secondary | ICD-10-CM | POA: Diagnosis not present

## 2022-12-24 DIAGNOSIS — Z01818 Encounter for other preprocedural examination: Secondary | ICD-10-CM | POA: Diagnosis not present

## 2023-01-02 DIAGNOSIS — Z881 Allergy status to other antibiotic agents status: Secondary | ICD-10-CM | POA: Diagnosis not present

## 2023-01-02 DIAGNOSIS — D1721 Benign lipomatous neoplasm of skin and subcutaneous tissue of right arm: Secondary | ICD-10-CM | POA: Diagnosis not present

## 2023-01-20 DIAGNOSIS — Z4789 Encounter for other orthopedic aftercare: Secondary | ICD-10-CM | POA: Diagnosis not present

## 2023-09-14 ENCOUNTER — Other Ambulatory Visit: Payer: Self-pay | Admitting: Internal Medicine

## 2023-09-14 DIAGNOSIS — Z1231 Encounter for screening mammogram for malignant neoplasm of breast: Secondary | ICD-10-CM

## 2023-09-15 ENCOUNTER — Ambulatory Visit
Admission: RE | Admit: 2023-09-15 | Discharge: 2023-09-15 | Discharge status: Home / Self Care | Source: Ambulatory Visit | Attending: Internal Medicine | Admitting: Internal Medicine

## 2023-09-15 DIAGNOSIS — Z1231 Encounter for screening mammogram for malignant neoplasm of breast: Secondary | ICD-10-CM | POA: Diagnosis not present

## 2023-09-16 DIAGNOSIS — Z1211 Encounter for screening for malignant neoplasm of colon: Secondary | ICD-10-CM | POA: Diagnosis not present

## 2023-09-16 DIAGNOSIS — Z86018 Personal history of other benign neoplasm: Secondary | ICD-10-CM | POA: Diagnosis not present

## 2023-09-16 DIAGNOSIS — Z23 Encounter for immunization: Secondary | ICD-10-CM | POA: Diagnosis not present

## 2023-09-16 DIAGNOSIS — E785 Hyperlipidemia, unspecified: Secondary | ICD-10-CM | POA: Diagnosis not present

## 2023-09-16 DIAGNOSIS — Z1231 Encounter for screening mammogram for malignant neoplasm of breast: Secondary | ICD-10-CM | POA: Diagnosis not present

## 2023-09-16 DIAGNOSIS — Z1331 Encounter for screening for depression: Secondary | ICD-10-CM | POA: Diagnosis not present

## 2023-09-16 DIAGNOSIS — K5901 Slow transit constipation: Secondary | ICD-10-CM | POA: Diagnosis not present

## 2023-09-16 DIAGNOSIS — Z0000 Encounter for general adult medical examination without abnormal findings: Secondary | ICD-10-CM | POA: Diagnosis not present

## 2023-09-16 DIAGNOSIS — R030 Elevated blood-pressure reading, without diagnosis of hypertension: Secondary | ICD-10-CM | POA: Diagnosis not present

## 2023-09-16 DIAGNOSIS — E669 Obesity, unspecified: Secondary | ICD-10-CM | POA: Diagnosis not present

## 2023-09-21 ENCOUNTER — Other Ambulatory Visit (HOSPITAL_BASED_OUTPATIENT_CLINIC_OR_DEPARTMENT_OTHER): Payer: Self-pay | Admitting: Internal Medicine

## 2023-09-21 DIAGNOSIS — E785 Hyperlipidemia, unspecified: Secondary | ICD-10-CM

## 2023-10-07 ENCOUNTER — Ambulatory Visit (HOSPITAL_BASED_OUTPATIENT_CLINIC_OR_DEPARTMENT_OTHER)
Admission: RE | Admit: 2023-10-07 | Discharge: 2023-10-07 | Disposition: A | Discharge status: Home / Self Care | Payer: Self-pay | Source: Ambulatory Visit | Attending: Internal Medicine | Admitting: Internal Medicine

## 2023-10-07 DIAGNOSIS — E785 Hyperlipidemia, unspecified: Secondary | ICD-10-CM | POA: Insufficient documentation
